# Patient Record
Sex: Female | Born: 1990 | Race: White | Hispanic: No | Marital: Single
Health system: Southern US, Community
[De-identification: ages and names within clinical notes are randomized; demographics above are authoritative.]

---

## 2020-09-24 ENCOUNTER — Ambulatory Visit (INDEPENDENT_AMBULATORY_CARE_PROVIDER_SITE_OTHER): Payer: BC Managed Care – PPO

## 2020-09-24 ENCOUNTER — Ambulatory Visit (INDEPENDENT_AMBULATORY_CARE_PROVIDER_SITE_OTHER): Payer: BC Managed Care – PPO | Admitting: Orthopedic Surgery

## 2020-09-24 DIAGNOSIS — M7541 Impingement syndrome of right shoulder: Secondary | ICD-10-CM | POA: Diagnosis not present

## 2020-09-24 DIAGNOSIS — M25511 Pain in right shoulder: Secondary | ICD-10-CM | POA: Diagnosis not present

## 2020-09-26 ENCOUNTER — Encounter: Payer: Self-pay | Admitting: Orthopedic Surgery

## 2020-09-26 ENCOUNTER — Ambulatory Visit (INDEPENDENT_AMBULATORY_CARE_PROVIDER_SITE_OTHER): Payer: BC Managed Care – PPO | Admitting: Orthopedic Surgery

## 2020-09-26 DIAGNOSIS — M7541 Impingement syndrome of right shoulder: Secondary | ICD-10-CM

## 2020-09-26 NOTE — Progress Notes (Signed)
Office Visit Note   Patient: Tanya Khan           Date of Birth: [birth year]           MRN: [id number] Visit Date: 09/24/2020 Requested by: Shea Evans, MD [address] [place],  [place] [address] PCP: Shea Evans, MD  Subjective: Chief Complaint  Patient presents with  . Right Shoulder - Pain    HPI: Tanya Khan is a 30 y.o. female who presents to the office complaining of right shoulder pain.  Patient complains of right shoulder pain since October 2021.  She reports pain that she localizes to the lateral right shoulder with radiation into the mid humerus.  This pain is worse with abduction of the shoulder as well as extension of the shoulder.  She cannot recall a specific injury but she does work out at [employer] and feels maybe she did "too much shoulder stuff".  She has stopped her shoulder exercises as of February but her pain is not significantly improved.  She complains of 3-4/10 pain that only really bothers her with activity.  She has been in physical therapy at [facility] since January to try and work through this pain.  She has had cupping and dry needling which have helped for a couple days at a time but never fully resolves her pain.  She is okay laying on the right side and never wakes with shoulder pain.  She has very rare instances of radiation into her hand or fingers with numbness/tingling but this only happens once every 1 to 2 weeks.  Denies any neck pain.  No history of prior right shoulder discomfort, dislocation, surgery.  No significant weakness with her main complaint is pain.  Denies any mechanical symptoms.  She has been taking Tylenol and ibuprofen with some relief.  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                ROS: All systems reviewed are negative as they relate to the chief complaint within the history of present illness.  Patient denies fevers or chills.  Assessment &  Plan: Visit Diagnoses:  1. Right shoulder pain, unspecified chronicity     Plan: Patient is a 30 year old female who presents complaining of right shoulder pain.  She has had pain without injury since October 2021 and has been trying to get through this and physical therapy without lasting relief.  She has had no prior problem with this shoulder and she cannot recall specific injury.  She does do a lot of CrossFit workouts but she has held off on doing any shoulder workouts since February without any improvement.  She has excellent cuff strength on exam and excellent range of motion.  She does have some positive impingement signs which suggest bursitis but also some signs on exam that may indicate bicep tendon or SLAP tear pathology.  No mechanical symptoms by her history though.  Discussed options available to patient.  She would like to try an injection.  However she has a severe needle phobia so plan to have her return on Wednesday when she will be more mentally prepared and will have her boyfriend who can provide some comfort for her.  If this injection fails to yield lasting relief, consider MRI arthrogram for further evaluation.  Patient agreed with plan.  Follow-Up Instructions: No follow-ups on file.   Orders:  Orders Placed This Encounter  Procedures  .  XR Shoulder Right   No orders of the defined types were placed in this encounter.     Procedures: No procedures performed   Clinical Data: No additional findings.  Objective: Vital Signs: There were no vitals taken for this visit.  Physical Exam:  Constitutional: Patient appears well-developed HEENT:  Head: Normocephalic Eyes:EOM are normal Neck: Normal range of motion Cardiovascular: Normal rate Pulmonary/chest: Effort normal Neurologic: Patient is alert Skin: Skin is warm Psychiatric: Patient has normal mood and affect  Ortho Exam: Ortho exam demonstrates right shoulder with 60 degrees external rotation, 120 degrees  abduction, 180 degrees forward flexion.  This compared with the left shoulder with 50 degrees external rotation, 130 degrees abduction, 180 degrees forward flexion.  No discrete AC joint tenderness.  Tenderness over the bicipital groove.  Positive Neer and Hawkins impingement signs.  Excellent 5/5 motor strength of the supraspinatus, infraspinatus, subscapularis bilaterally.  5/5 motor strength of the bilateral grip strength, finger abduction, pronation/supination, bicep, tricep, deltoid.  No pain with active cervical spine range of motion.  Negative Spurling sign.  No crepitus noted with passive motion of the shoulder.  Negative Hornblower sign.  Negative drop arm test.  Positive O'Brien sign.  Specialty Comments:  No specialty comments available.  Imaging: No results found.   PMFS History: There are no problems to display for this patient.  No past medical history on file.  No family history on file.  History reviewed. No pertinent surgical history. Social History   Occupational History  . Not on file  Tobacco Use  . Smoking status: Not on file  . Smokeless tobacco: Not on file  Substance and Sexual Activity  . Alcohol use: Not on file  . Drug use: Not on file  . Sexual activity: Not on file

## 2020-09-27 ENCOUNTER — Encounter: Payer: Self-pay | Admitting: Orthopedic Surgery

## 2020-09-27 DIAGNOSIS — M7541 Impingement syndrome of right shoulder: Secondary | ICD-10-CM | POA: Diagnosis not present

## 2020-09-27 NOTE — Progress Notes (Signed)
   Procedure Note  Patient: Tanya Khan             Date of Birth: Jan 01, 1991           MRN: [id number]             Visit Date: 09/26/2020  Procedures: Visit Diagnoses:  1. Impingement syndrome of right shoulder     Large Joint Inj: R subacromial bursa on 09/27/2020 5:41 PM Indications: diagnostic evaluation and pain Details: 18 G 1.5 in needle, posterior approach  Arthrogram: No  Medications: 9 mL bupivacaine 0.5 %; 40 mg methylPREDNISolone acetate 40 MG/ML; 5 mL lidocaine 1 % Outcome: tolerated well, no immediate complications  Patient returns for scheduled right subacromial injection.  Tolerated the procedure well though she does have severe phobia and she had to be consoled after initial injection.  Overall she did well and plan to follow-up in 6 weeks for clinical recheck.  If little to no improvement, consider MRI arthrogram at that time. Procedure, treatment alternatives, risks and benefits explained, specific risks discussed. Consent was given by the patient. Immediately prior to procedure a time out was called to verify the correct patient, procedure, equipment, support staff and site/side marked as required. Patient was prepped and draped in the usual sterile fashion.

## 2020-09-29 ENCOUNTER — Encounter: Payer: Self-pay | Admitting: Orthopedic Surgery

## 2020-09-29 MED ORDER — LIDOCAINE HCL 1 % IJ SOLN
5.0000 mL | INTRAMUSCULAR | Status: AC | PRN
  Administered 2020-09-27: 5 mL

## 2020-09-29 MED ORDER — BUPIVACAINE HCL 0.5 % IJ SOLN
9.0000 mL | INTRAMUSCULAR | Status: AC | PRN
  Administered 2020-09-27: 9 mL via INTRA_ARTICULAR

## 2020-09-29 MED ORDER — METHYLPREDNISOLONE ACETATE 40 MG/ML IJ SUSP
40.0000 mg | INTRAMUSCULAR | Status: AC | PRN
  Administered 2020-09-27: 40 mg via INTRA_ARTICULAR

## 2020-11-07 ENCOUNTER — Ambulatory Visit (INDEPENDENT_AMBULATORY_CARE_PROVIDER_SITE_OTHER): Payer: BC Managed Care – PPO | Admitting: Orthopedic Surgery

## 2020-11-07 DIAGNOSIS — M7541 Impingement syndrome of right shoulder: Secondary | ICD-10-CM

## 2020-11-11 ENCOUNTER — Encounter: Payer: Self-pay | Admitting: Orthopedic Surgery

## 2020-11-11 NOTE — Progress Notes (Signed)
   Office Visit Note   Patient: Tanya Khan           Date of Birth: Nov 01, 1990           MRN: [id number] Visit Date: 11/07/2020 Requested by: Shea Evans, MD [address] [place],  [place] [address] PCP: Shea Evans, MD  Subjective: Chief Complaint  Patient presents with   Right Shoulder - Follow-up    HPI: Tanya Khan is a 30 year old patient with right shoulder pain.  She had subacromial injection 09/26/2020.  Good relief for 2 weeks but the pain recurred with return of her activities.  She feels it when she is chopping up vegetables.  She feels like she is back to square 1.  Takes occasional over-the-counter medication.  She has a dull pain level 3 out of 10 with no activity.  She does do CrossFit types of activities.  Denies any neck pain or radicular symptoms.  She has done some dry needling with physical therapy.  She plays the guitar which is also becoming slightly more difficult.              ROS: All systems reviewed are negative as they relate to the chief complaint within the history of present illness.  Patient denies  fevers or chills.   Assessment & Plan: Visit Diagnoses:  1. Impingement syndrome of right shoulder     Plan: Impression is very subtle loss of motion with forward flexion and AB duction.  Otherwise this shoulder structurally looks to be intact with rotator cuff strength and labral testing.  No discrete AC joint tenderness is present.  Plan MRI arthrogram of the right shoulder to evaluate the labrum as well as evaluate for early signs of adhesive capsulitis.  Follow-up after that study.  Follow-Up Instructions: Return for after MRI.   Orders:  Orders Placed This Encounter  Procedures   MR SHOULDER RIGHT W CONTRAST   Arthrogram   No orders of the defined types were placed in this encounter.     Procedures: No procedures performed   Clinical Data: No additional findings.  Objective: Vital Signs: There were no vitals taken for this  visit.  Physical Exam:   Constitutional: Patient appears well-developed HEENT:  Head: Normocephalic Eyes:EOM are normal Neck: Normal range of motion Cardiovascular: Normal rate Pulmonary/chest: Effort normal Neurologic: Patient is alert Skin: Skin is warm Psychiatric: Patient has normal mood and affect   Ortho Exam: Ortho exam demonstrates excellent shoulder strength with infraspinatus supraspinatus subscap muscle testing.  Negative O'Brien's testing bilaterally.  No discrete AC joint tenderness is present.  Range of motion wise the patient on the right has passive range of motion of 65/95/170.  On the left it is 65/105/180.  No coarse grinding or crepitus with active or passive range of motion of the right shoulder.  Specialty Comments:  No specialty comments available.  Imaging: No results found.   PMFS History: There are no problems to display for this patient.  History reviewed. No pertinent past medical history.  History reviewed. No pertinent family history.  History reviewed. No pertinent surgical history. Social History   Occupational History   Not on file  Tobacco Use   Smoking status: Not on file   Smokeless tobacco: Not on file  Substance and Sexual Activity   Alcohol use: Not on file   Drug use: Not on file   Sexual activity: Not on file

## 2020-11-27 ENCOUNTER — Other Ambulatory Visit: Payer: Self-pay | Admitting: Orthopedic Surgery

## 2020-11-27 ENCOUNTER — Ambulatory Visit
Admission: RE | Admit: 2020-11-27 | Discharge: 2020-11-27 | Disposition: A | Discharge status: Home / Self Care | Payer: BC Managed Care – PPO | Source: Ambulatory Visit | Attending: Orthopedic Surgery | Admitting: Orthopedic Surgery

## 2020-11-27 DIAGNOSIS — M7541 Impingement syndrome of right shoulder: Secondary | ICD-10-CM

## 2020-12-05 ENCOUNTER — Ambulatory Visit: Payer: BC Managed Care – PPO | Admitting: Orthopedic Surgery

## 2020-12-11 ENCOUNTER — Ambulatory Visit
Admission: RE | Admit: 2020-12-11 | Discharge: 2020-12-11 | Disposition: A | Discharge status: Home / Self Care | Payer: BC Managed Care – PPO | Source: Ambulatory Visit | Attending: Orthopedic Surgery | Admitting: Orthopedic Surgery

## 2020-12-11 ENCOUNTER — Other Ambulatory Visit: Payer: Self-pay

## 2020-12-11 DIAGNOSIS — M7541 Impingement syndrome of right shoulder: Secondary | ICD-10-CM

## 2020-12-11 MED ORDER — IOPAMIDOL (ISOVUE-M 300) INJECTION 61%
12.0000 mL | Freq: Once | INTRAMUSCULAR | Status: AC | PRN
  Administered 2020-12-11: 12 mL via INTRA_ARTICULAR

## 2020-12-14 ENCOUNTER — Other Ambulatory Visit: Payer: Self-pay

## 2020-12-14 ENCOUNTER — Ambulatory Visit (INDEPENDENT_AMBULATORY_CARE_PROVIDER_SITE_OTHER): Payer: BC Managed Care – PPO | Admitting: Orthopedic Surgery

## 2020-12-14 ENCOUNTER — Encounter: Payer: Self-pay | Admitting: Orthopedic Surgery

## 2020-12-14 DIAGNOSIS — M7541 Impingement syndrome of right shoulder: Secondary | ICD-10-CM

## 2020-12-14 NOTE — Progress Notes (Signed)
Office Visit Note   Patient: Tanya Khan           Date of Birth: Nov 22, 1990           MRN: [id number] Visit Date: 12/14/2020 Requested by: Shea Evans, MD [address] [place],  [place] [address] PCP: Shea Evans, MD  Subjective: Chief Complaint  Patient presents with   Other     Scan review    HPI: Tanya Khan is a 30 y.o. female who presents to the office complaining of right shoulder pain.  Patient returns to discuss MRI of the right shoulder.  She continues to complain of lateral and superior pain of the right shoulder particularly with overhead motion.  She has switched physical therapists from her prior therapist to a new therapist that allows her to do more active exercises and okays her for activity up to 3/10 on the pain scale.  She feels this is helping more than the previous therapy did.  She still has difficulties with certain movements and exercises that she tries to do such as kip pull-up, bringing the bar down from a overhead clean, bowling or disc golfing..                ROS: All systems reviewed are negative as they relate to the chief complaint within the history of present illness.  Patient denies fevers or chills.  Assessment & Plan: Visit Diagnoses:  1. Impingement syndrome of right shoulder     Plan: Patient is a 30 year old female who returns for evaluation of right shoulder pain.  She has history of right shoulder pain that particularly bothers her with overhead motion.  She is an avid exercise enthusiast who enjoys CrossFit.  She is still doing CrossFit but only performing exercises that do not cause her shoulder pain.  MRI was reviewed with her today that reveals small amount of distal supraspinatus tendinosis with mild fraying.  There is a small subacromial spur anteriorly but on sagittal view it does not appear to significantly compress the rotator cuff though theoretically with a lot of overhead motion she does, this could be the source  of the fraying and tendinosis of the supraspinatus.  Discussed options available to patient with main options being continuing with physical therapy versus operative intervention.  With more mild to moderate findings on the MRI, surgery is not as predictable so recommend that she try 6 more weeks of dedicated physical therapy to focus specifically on rotator cuff strengthening exercises of the supraspinatus.  Most of the Tenny Craw is bursal sided which should respond well to PT but if she has no significant symptomatic improvement 6 weeks from now, consider surgery as she is exhausted all other options.  Patient agreed with this plan.  She will follow-up with the office in 6 weeks.  Follow-Up Instructions: No follow-ups on file.   Orders:  No orders of the defined types were placed in this encounter.  No orders of the defined types were placed in this encounter.     Procedures: No procedures performed   Clinical Data: No additional findings.  Objective: Vital Signs: There were no vitals taken for this visit.  Physical Exam:  Constitutional: Patient appears well-developed HEENT:  Head: Normocephalic Eyes:EOM are normal Neck: Normal range of motion Cardiovascular: Normal rate Pulmonary/chest: Effort normal Neurologic: Patient is alert Skin: Skin is warm Psychiatric: Patient has normal mood and affect  Ortho Exam: Ortho exam demonstrates right shoulder with 70 degrees external rotation, 100 degrees abduction, 180 degrees  forward flexion.  She has small amount of crepitus laterally with no significant pain with passive range of motion the shoulder.  She has no tenderness over the [facility] joint but moderate tenderness over the bicipital groove.  Positive Hawkins impingement sign causing moderate pain.  Negative Neer impingement sign.  Excellent external rotation and subscapularis strength rated 5/5.  5 -/5 supraspinatus strength that seems mostly limited by pain compared with the contralateral  side that is rated 5/5.  Active range of motion equivalent to passive range of motion.  Specialty Comments:  No specialty comments available.  Imaging: No results found.   PMFS History: There are no problems to display for this patient.  No past medical history on file.  No family history on file.  No past surgical history on file. Social History   Occupational History   Not on file  Tobacco Use   Smoking status: Not on file   Smokeless tobacco: Not on file  Substance and Sexual Activity   Alcohol use: Not on file   Drug use: Not on file   Sexual activity: Not on file

## 2020-12-21 ENCOUNTER — Telehealth: Payer: Self-pay | Admitting: Orthopedic Surgery

## 2020-12-21 NOTE — Telephone Encounter (Signed)
Pt wants to go ahead and sch for surg. Pt's P.T said nothing has changed over the period of several weeks and believes there will be no improvement so mentioned to the pt to go ahead and see if surg is an option. The best call number is [phone].

## 2020-12-24 NOTE — Telephone Encounter (Signed)
Done thx

## 2020-12-25 ENCOUNTER — Telehealth: Payer: Self-pay | Admitting: Orthopedic Surgery

## 2020-12-25 NOTE — Telephone Encounter (Signed)
Pt called requesting a call back from Lauren F notifying Dr. August Saucer she wants to proceed with surgery. Please call pt at [phone].

## 2020-12-25 NOTE — Telephone Encounter (Signed)
Done thx do you have it?

## 2020-12-25 NOTE — Telephone Encounter (Signed)
Please see message below, Can surgery sheet be filled out.

## 2021-01-23 ENCOUNTER — Ambulatory Visit: Payer: BC Managed Care – PPO | Admitting: Orthopedic Surgery

## 2021-01-28 ENCOUNTER — Other Ambulatory Visit: Payer: Self-pay | Admitting: Surgical

## 2021-01-28 ENCOUNTER — Encounter: Payer: Self-pay | Admitting: Orthopedic Surgery

## 2021-01-28 DIAGNOSIS — M7541 Impingement syndrome of right shoulder: Secondary | ICD-10-CM | POA: Diagnosis not present

## 2021-01-28 MED ORDER — METHOCARBAMOL 500 MG PO TABS: 500.0000 mg | ORAL_TABLET | Freq: Three times a day (TID) | ORAL | 0 refills | Status: AC | PRN

## 2021-01-28 MED ORDER — GABAPENTIN 300 MG PO CAPS: 300.0000 mg | ORAL_CAPSULE | Freq: Three times a day (TID) | ORAL | 0 refills | Status: AC

## 2021-01-28 MED ORDER — CELECOXIB 100 MG PO CAPS: 100.0000 mg | ORAL_CAPSULE | Freq: Two times a day (BID) | ORAL | 0 refills | Status: AC

## 2021-01-28 MED ORDER — OXYCODONE-ACETAMINOPHEN 5-325 MG PO TABS: 1.0000 | ORAL_TABLET | ORAL | 0 refills | Status: AC | PRN

## 2021-01-29 ENCOUNTER — Telehealth: Payer: Self-pay

## 2021-01-29 NOTE — Telephone Encounter (Signed)
Dr Dean called and discussed with patient.  ? ?

## 2021-01-29 NOTE — Telephone Encounter (Signed)
Patient called stating that she started having right shoulder numbness about an hour ago.  Patient had right shoulder surgery with Dr. August Saucer on Monday, 01/28/2021.  Would like a call back at [phone].  Please advise.  Thank you.

## 2021-01-29 NOTE — Telephone Encounter (Signed)
I spoke with patient. Started around noon with numbness. Did try her sling without relief. Messaged Dr August Saucer and Franky Macho to see if they could advise between surgery cases.

## 2021-01-31 ENCOUNTER — Encounter: Payer: Self-pay | Admitting: Orthopedic Surgery

## 2021-02-04 ENCOUNTER — Other Ambulatory Visit: Payer: Self-pay

## 2021-02-04 ENCOUNTER — Ambulatory Visit (INDEPENDENT_AMBULATORY_CARE_PROVIDER_SITE_OTHER): Payer: BC Managed Care – PPO | Admitting: Orthopedic Surgery

## 2021-02-04 ENCOUNTER — Encounter: Payer: Self-pay | Admitting: Orthopedic Surgery

## 2021-02-04 DIAGNOSIS — M7541 Impingement syndrome of right shoulder: Secondary | ICD-10-CM

## 2021-02-04 NOTE — Progress Notes (Signed)
   Post-Op Visit Note   Patient: Tanya Khan           Date of Birth: Dec 25, 1990           MRN: [id number] Visit Date: 02/04/2021 PCP: Shea Evans, MD   Assessment & Plan:  Chief Complaint:  Chief Complaint  Patient presents with   Right Shoulder - Post-op Follow-up   Visit Diagnoses:  1. Impingement syndrome of right shoulder     Plan: Patient is a 30 year old female who presents s/p right shoulder arthroscopic subacromial decompression on 01/28/2021.  She reports 1/10 pain.  She is only taking occasional Advil and has taken none today.  Not having to take any opioid medication for pain.  Does occasionally use muscle relaxer.  She is sleeping well at night.  Denies any numbness or tingling or any mechanical symptoms.  Sutures removed and replaced with Steri-Strips today.  Incision seems to be healing well.  On exam she has 45 degrees external rotation, 100 degrees abduction, 180 degrees forward flexion.  Axillary nerve intact with deltoid firing.  Excellent active range of motion which is equivalent to her passive range of motion.  Reviewed pictures with her today.  She wants to do formal physical therapy with a therapist who is a friend and works at a [employer].  Cautioned her against any overhead lifting or any heavy lifting with the arm but okay for her to do single arm exercises with the other arm and specifically work on rotator cuff strengthening over the next 4 weeks.  Reevaluate in 4 weeks with Dr. August Saucer.  Follow-Up Instructions: No follow-ups on file.   Orders:  No orders of the defined types were placed in this encounter.  No orders of the defined types were placed in this encounter.   Imaging: No results found.  PMFS History: There are no problems to display for this patient.  History reviewed. No pertinent past medical history.  History reviewed. No pertinent family history.  History reviewed. No pertinent surgical history. Social History    Occupational History   Not on file  Tobacco Use   Smoking status: Not on file   Smokeless tobacco: Not on file  Substance and Sexual Activity   Alcohol use: Not on file   Drug use: Not on file   Sexual activity: Not on file

## 2021-03-04 ENCOUNTER — Encounter: Payer: Self-pay | Admitting: Orthopedic Surgery

## 2021-03-04 ENCOUNTER — Ambulatory Visit (INDEPENDENT_AMBULATORY_CARE_PROVIDER_SITE_OTHER): Payer: BC Managed Care – PPO | Admitting: Orthopedic Surgery

## 2021-03-04 ENCOUNTER — Other Ambulatory Visit: Payer: Self-pay

## 2021-03-04 DIAGNOSIS — M7541 Impingement syndrome of right shoulder: Secondary | ICD-10-CM

## 2021-03-10 ENCOUNTER — Encounter: Payer: Self-pay | Admitting: Orthopedic Surgery

## 2021-03-10 NOTE — Progress Notes (Signed)
   Post-Op Visit Note   Patient: Tanya Khan           Date of Birth: 04-09-1991           MRN: [id number] Visit Date: 03/04/2021 PCP: Tanya Evans, MD   Assessment & Plan:  Chief Complaint:  Chief Complaint  Patient presents with   Right Shoulder - Post-op Follow-up   Visit Diagnoses:  1. Impingement syndrome of right shoulder     Plan: Tanya Khan is a patient is now 5 weeks out right shoulder arthroscopy and subacromial decompression.  She has been doing physical therapy with range of motion and bands doing I's Y's and T's.  She is at the gym doing "light Burpee's" has some occasional soreness.  Rates pain as 1 out of 10.  On examination she has full active and passive range of motion with no impingement signs and good rotator cuff strength on the right.  Plan is 6-week return.  I really do not want her doing anything more than rotator cuff strengthening and range of motion exercises.  I think doing too much of the heavy duty push-up type activities is not a great idea for the shoulder at this time.  I think after 6 more weeks with a focus program of cuff strengthening she would be better suited to resume high-impact activities.  Follow-up in 6 weeks for final check.  Follow-Up Instructions: Return in about 6 weeks (around 04/15/2021).   Orders:  No orders of the defined types were placed in this encounter.  No orders of the defined types were placed in this encounter.   Imaging: No results found.  PMFS History: There are no problems to display for this patient.  History reviewed. No pertinent past medical history.  History reviewed. No pertinent family history.  History reviewed. No pertinent surgical history. Social History   Occupational History   Not on file  Tobacco Use   Smoking status: Not on file   Smokeless tobacco: Not on file  Substance and Sexual Activity   Alcohol use: Not on file   Drug use: Not on file   Sexual activity: Not on file

## 2021-04-17 ENCOUNTER — Ambulatory Visit (INDEPENDENT_AMBULATORY_CARE_PROVIDER_SITE_OTHER): Payer: BC Managed Care – PPO | Admitting: Orthopedic Surgery

## 2021-04-17 ENCOUNTER — Encounter: Payer: Self-pay | Admitting: Orthopedic Surgery

## 2021-04-17 ENCOUNTER — Other Ambulatory Visit: Payer: Self-pay

## 2021-04-17 DIAGNOSIS — M7541 Impingement syndrome of right shoulder: Secondary | ICD-10-CM

## 2021-04-17 NOTE — Progress Notes (Signed)
  Post-Op Visit Note   Patient: Tanya Khan           Date of Birth: 1990/10/18           MRN: [id number] Visit Date: 04/17/2021 PCP: Shea Evans, MD   Assessment & Plan:  Chief Complaint:  Chief Complaint  Patient presents with   Right Shoulder - Follow-up   Visit Diagnoses:  1. Impingement syndrome of right shoulder     Plan: [name] is a 30 year old patient who is now almost 3 months out right shoulder arthroscopy with subacromial decompression.  She has been doing rotator cuff strengthening bands and light dumbbells.  She is working 75% body weight.  Sleeping is okay on the right-hand side.  Feels occasional tightness.  No medications.  Works at [employer]. On exam she has full active and passive range of motion of the right shoulder with good rotator cuff strength.  No coarse grinding or crepitus with internal or external Tatian of the arm.  Plan at this time is to transition with physical therapy back to near her preoperative level of activities but not to the preoperative level of activities.  Follow-up as needed.  Follow-Up Instructions: Return if symptoms worsen or fail to improve.   Orders:  No orders of the defined types were placed in this encounter.  No orders of the defined types were placed in this encounter.   Imaging: No results found.  PMFS History: There are no problems to display for this patient.  History reviewed. No pertinent past medical history.  History reviewed. No pertinent family history.  History reviewed. No pertinent surgical history. Social History   Occupational History   Not on file  Tobacco Use   Smoking status: Not on file   Smokeless tobacco: Not on file  Substance and Sexual Activity   Alcohol use: Not on file   Drug use: Not on file   Sexual activity: Not on file

## 2022-09-19 IMAGING — XA DG FLUORO GUIDE NDL PLC/BX
1 series · 1 of 1 positions shown · IV contrast (multihance)
Comparison: none

CLINICAL DATA: Impingement syndrome of right shoulder.

EXAM:
RIGHT SHOULDER INJECTION UNDER FLUOROSCOPY
TECHNIQUE: An appropriate skin entrance site was determined. The site was
marked, prepped with Betadine, draped in the usual sterile fashion,
and infiltrated locally with 1% lidocaine. A 22 gauge spinal needle
was advanced to the superomedial margin of the humeral head under
intermittent fluoroscopy. 1 mL of 1% lidocaine injected easily. A
mixture of 0.1 mL of MultiHance, 10 mL of Isovue-M 300, and 10 mL of
sterile saline was then used to opacify the right shoulder capsule.
10 mL of this mixture were injected. No immediate complication.
The procedure was performed by Johany Christopher, NP under direct
supervision.
FLUOROSCOPY TIME:  Fluoroscopy Time:  39 seconds
Radiation Exposure Index (if provided by the fluoroscopic device):
13.65 microGray*m^2
Number of Acquired Spot Images: 0

[Series 1: ortho adipose · 1 of 1 slices shown]
[im 1/1]
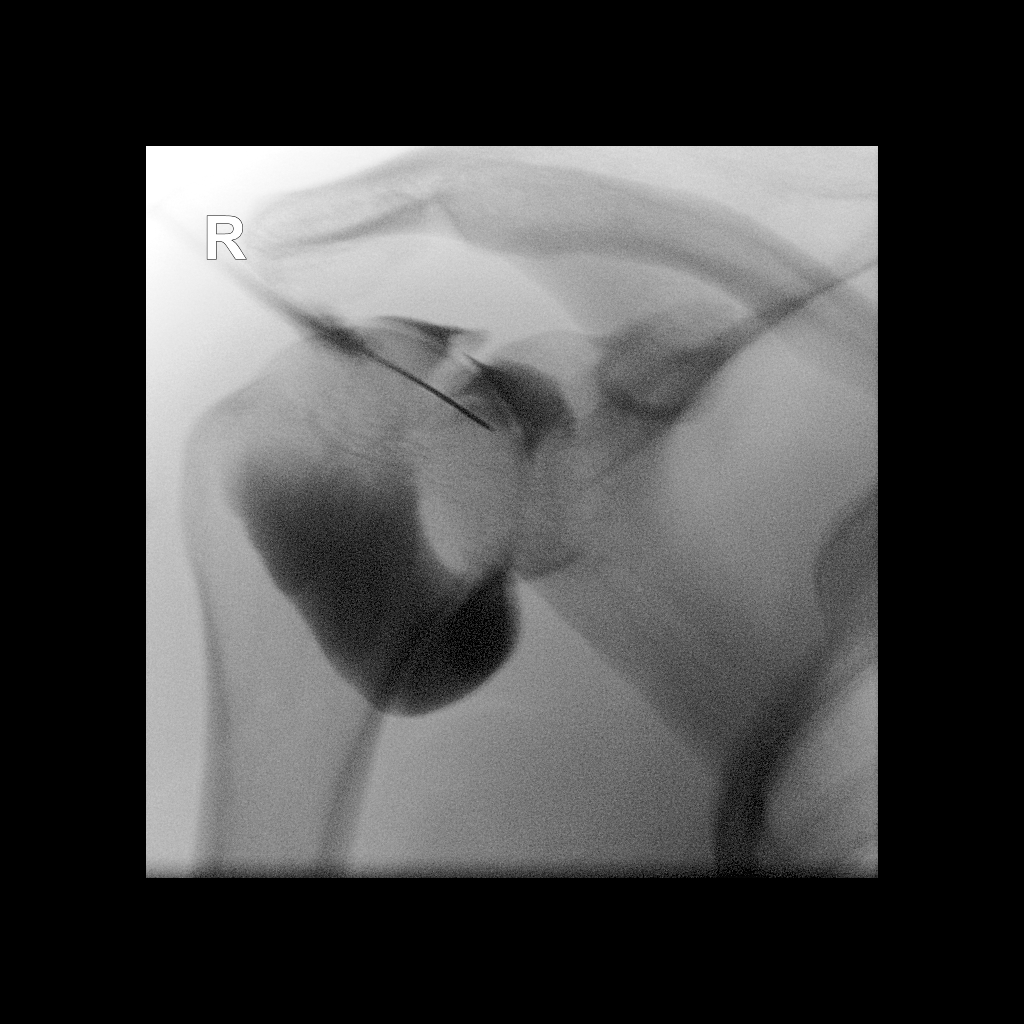

[1 of 1 positions shown; findings below may reference images not displayed]

IMPRESSION: Technically successful right shoulder injection for MRI.
